# Patient Record
Sex: Female | Born: 1970 | Race: White | Hispanic: No | Marital: Married | Smoking: Former smoker
Health system: Southern US, Community
[De-identification: ages and names within clinical notes are randomized; demographics above are authoritative.]

## PROBLEM LIST (undated history)

## (undated) DIAGNOSIS — F32A Depression, unspecified: Secondary | ICD-10-CM

## (undated) DIAGNOSIS — Z5189 Encounter for other specified aftercare: Secondary | ICD-10-CM

## (undated) DIAGNOSIS — F329 Major depressive disorder, single episode, unspecified: Secondary | ICD-10-CM

## (undated) DIAGNOSIS — F419 Anxiety disorder, unspecified: Secondary | ICD-10-CM

## (undated) HISTORY — PX: ABDOMINAL SURGERY: SHX537

## (undated) HISTORY — PX: ABDOMINAL HYSTERECTOMY: SHX81

## (undated) HISTORY — PX: TUBAL LIGATION: SHX77

---

## 2005-06-06 ENCOUNTER — Emergency Department (HOSPITAL_COMMUNITY): Admission: EM | Admit: 2005-06-06 | Discharge: 2005-06-06 | Payer: Self-pay | Admitting: Emergency Medicine

## 2005-06-07 ENCOUNTER — Encounter: Admission: RE | Admit: 2005-06-07 | Discharge: 2005-06-07 | Payer: Self-pay | Admitting: Family Medicine

## 2009-05-03 ENCOUNTER — Emergency Department (HOSPITAL_COMMUNITY): Admission: EM | Admit: 2009-05-03 | Discharge: 2009-05-03 | Payer: Self-pay | Admitting: Emergency Medicine

## 2010-03-09 ENCOUNTER — Emergency Department (HOSPITAL_COMMUNITY): Admission: EM | Admit: 2010-03-09 | Discharge: 2010-03-09 | Payer: Self-pay | Admitting: Emergency Medicine

## 2010-10-08 LAB — DIFFERENTIAL
Basophils Absolute: 0.1 10*3/uL (ref 0.0–0.1)
Basophils Relative: 1 % (ref 0–1)
Eosinophils Absolute: 0.2 10*3/uL (ref 0.0–0.7)
Eosinophils Relative: 2 % (ref 0–5)
Lymphocytes Relative: 20 % (ref 12–46)
Lymphs Abs: 2.3 10*3/uL (ref 0.7–4.0)
Monocytes Absolute: 0.7 10*3/uL (ref 0.1–1.0)
Monocytes Relative: 6 % (ref 3–12)
Neutro Abs: 8.3 10*3/uL — ABNORMAL HIGH (ref 1.7–7.7)
Neutrophils Relative %: 72 % (ref 43–77)

## 2010-10-08 LAB — RAPID URINE DRUG SCREEN, HOSP PERFORMED
Amphetamines: NOT DETECTED
Barbiturates: NOT DETECTED
Benzodiazepines: NOT DETECTED
Cocaine: NOT DETECTED
Opiates: NOT DETECTED
Tetrahydrocannabinol: NOT DETECTED

## 2010-10-08 LAB — PREGNANCY, URINE: Preg Test, Ur: NEGATIVE

## 2010-10-08 LAB — URINALYSIS, ROUTINE W REFLEX MICROSCOPIC
Bilirubin Urine: NEGATIVE
Glucose, UA: NEGATIVE mg/dL
Hgb urine dipstick: NEGATIVE
Ketones, ur: NEGATIVE mg/dL
Nitrite: NEGATIVE
Protein, ur: NEGATIVE mg/dL
Specific Gravity, Urine: 1.011 (ref 1.005–1.030)
Urobilinogen, UA: 0.2 mg/dL (ref 0.0–1.0)
pH: 7.5 (ref 5.0–8.0)

## 2010-10-08 LAB — CBC
HCT: 40.5 % (ref 36.0–46.0)
Hemoglobin: 13.7 g/dL (ref 12.0–15.0)
MCH: 33 pg (ref 26.0–34.0)
MCHC: 33.8 g/dL (ref 30.0–36.0)
MCV: 97.6 fL (ref 78.0–100.0)
Platelets: 293 10*3/uL (ref 150–400)
RBC: 4.15 MIL/uL (ref 3.87–5.11)
RDW: 13.3 % (ref 11.5–15.5)
WBC: 11.5 10*3/uL — ABNORMAL HIGH (ref 4.0–10.5)

## 2010-10-08 LAB — BASIC METABOLIC PANEL
BUN: 12 mg/dL (ref 6–23)
CO2: 25 mEq/L (ref 19–32)
Calcium: 9.4 mg/dL (ref 8.4–10.5)
Chloride: 107 mEq/L (ref 96–112)
Creatinine, Ser: 0.59 mg/dL (ref 0.4–1.2)
GFR calc Af Amer: >60 mL/min (ref >60)
GFR calc non Af Amer: >60 mL/min (ref >60)
Glucose, Bld: 104 mg/dL — ABNORMAL HIGH (ref 70–99)
Potassium: 4.5 mEq/L (ref 3.5–5.1)
Sodium: 139 mEq/L (ref 135–145)

## 2010-10-08 LAB — ETHANOL: Alcohol, Ethyl (B): 5 mg/dL (ref 0–10)

## 2010-11-22 ENCOUNTER — Other Ambulatory Visit: Payer: Self-pay | Admitting: Obstetrics and Gynecology

## 2010-11-22 ENCOUNTER — Encounter (HOSPITAL_COMMUNITY): Payer: Managed Care, Other (non HMO)

## 2010-11-22 LAB — COMPREHENSIVE METABOLIC PANEL
ALT: 17 U/L (ref 0–35)
AST: 16 U/L (ref 0–37)
Albumin: 3.9 g/dL (ref 3.5–5.2)
Alkaline Phosphatase: 58 U/L (ref 39–117)
BUN: 9 mg/dL (ref 6–23)
Calcium: 9.1 mg/dL (ref 8.4–10.5)
Creatinine, Ser: 0.51 mg/dL (ref 0.4–1.2)
GFR calc Af Amer: >60 mL/min (ref >60)
GFR calc non Af Amer: >60 mL/min (ref >60)
Potassium: 3.8 mEq/L (ref 3.5–5.1)
Sodium: 137 mEq/L (ref 135–145)
Total Bilirubin: 0.5 mg/dL (ref 0.3–1.2)
Total Protein: 7.3 g/dL (ref 6.0–8.3)

## 2010-11-22 LAB — DIFFERENTIAL
Basophils Absolute: 0.1 10*3/uL (ref 0.0–0.1)
Basophils Relative: 1 % (ref 0–1)
Basophils Relative: 1 % (ref 0–1)
Eosinophils Absolute: 0.3 10*3/uL (ref 0.0–0.7)
Eosinophils Absolute: 0.3 10*3/uL (ref 0.0–0.7)
Eosinophils Relative: 2 % (ref 0–5)
Eosinophils Relative: 2 % (ref 0–5)
Lymphs Abs: 2.7 10*3/uL (ref 0.7–4.0)
Lymphs Abs: 2.7 10*3/uL (ref 0.7–4.0)
Monocytes Absolute: 1.1 10*3/uL — ABNORMAL HIGH (ref 0.1–1.0)
Monocytes Absolute: 1.1 10*3/uL — ABNORMAL HIGH (ref 0.1–1.0)
Monocytes Relative: 8 % (ref 3–12)
Monocytes Relative: 8 % (ref 3–12)
Neutro Abs: 9.2 10*3/uL — ABNORMAL HIGH (ref 1.7–7.7)
Neutro Abs: 9.2 10*3/uL — ABNORMAL HIGH (ref 1.7–7.7)
Neutrophils Relative %: 69 % (ref 43–77)
Neutrophils Relative %: 69 % (ref 43–77)

## 2010-11-22 LAB — CBC
HCT: 41.5 % (ref 36.0–46.0)
Hemoglobin: 13.8 g/dL (ref 12.0–15.0)
MCH: 32 pg (ref 26.0–34.0)
MCHC: 33.3 g/dL (ref 30.0–36.0)
MCV: 96.3 fL (ref 78.0–100.0)
Platelets: 326 10*3/uL (ref 150–400)
RBC: 4.31 MIL/uL (ref 3.87–5.11)
WBC: 13.3 10*3/uL — ABNORMAL HIGH (ref 4.0–10.5)

## 2010-11-22 LAB — SURGICAL PCR SCREEN
MRSA, PCR: NEGATIVE
Staphylococcus aureus: NEGATIVE

## 2010-11-29 ENCOUNTER — Other Ambulatory Visit: Payer: Self-pay | Admitting: Obstetrics and Gynecology

## 2010-11-29 ENCOUNTER — Ambulatory Visit (HOSPITAL_COMMUNITY)
Admission: RE | Admit: 2010-11-29 | Discharge: 2010-11-30 | Disposition: A | Discharge status: Home / Self Care | Payer: Managed Care, Other (non HMO) | Source: Ambulatory Visit | Attending: Obstetrics and Gynecology | Admitting: Obstetrics and Gynecology

## 2010-11-29 DIAGNOSIS — N946 Dysmenorrhea, unspecified: Secondary | ICD-10-CM | POA: Insufficient documentation

## 2010-11-29 DIAGNOSIS — Z01818 Encounter for other preprocedural examination: Secondary | ICD-10-CM | POA: Insufficient documentation

## 2010-11-29 DIAGNOSIS — N920 Excessive and frequent menstruation with regular cycle: Secondary | ICD-10-CM | POA: Insufficient documentation

## 2010-11-29 DIAGNOSIS — Z01812 Encounter for preprocedural laboratory examination: Secondary | ICD-10-CM | POA: Insufficient documentation

## 2010-11-30 LAB — CBC
HCT: 34 % — ABNORMAL LOW (ref 36.0–46.0)
Hemoglobin: 11.1 g/dL — ABNORMAL LOW (ref 12.0–15.0)
MCH: 31.7 pg (ref 26.0–34.0)
MCHC: 32.6 g/dL (ref 30.0–36.0)
MCV: 97.1 fL (ref 78.0–100.0)
RBC: 3.5 MIL/uL — ABNORMAL LOW (ref 3.87–5.11)
RDW: 13 % (ref 11.5–15.5)

## 2010-12-09 NOTE — H&P (Addendum)
Kim [name], [name]               ACCOUNT NO.:  [id number]  MEDICAL RECORD NO.:  [id number]           PATIENT TYPE:  LOCATION:                                 FACILITY:  PHYSICIAN:  Malachi Pro. Ambrose Mantle, M.D. DATE OF BIRTH:  June 24, 1971  DATE OF ADMISSION:  11/29/2010 DATE OF DISCHARGE:                             HISTORY & PHYSICAL   PRESENT ILLNESS:  This is a 40 year old female para 2-0-0-3 who is admitted to the hospital for persistent abnormal uterine bleeding and dysmenorrhea for a vaginal hysterectomy.  The patient's last menstrual period was October 15, 2010 that lasted 20 days prior.  It was October 02, 2010, lasted 7 days.  Before that September 17, 2010, lasted 7 days.  I saw her initially in 2008 and at that time she was having painful periods.  I treated her with nonsteroidal anti-inflammatory drugs and in 2009 she returned with complaints of abnormal uterine bleeding.  She was treated with progesterone on a cyclic basis.  This was not effective so she was treated with 28 days of progesterone and again that was ineffective.  An ultrasound done in July 2011 after being gone for 2-1/2 years, the patient returned in July 2011 with continued complaints of abnormal bleeding and underwent an ultrasound which showed a thin endometrium measuring a little over 4 mm.  No fibroids or any other pelvic abnormalities to explain the abnormal bleeding.  In December 2011 because of continued complaints of abnormal bleeding, she underwent an endometrial biopsy that showed proliferative endometrium with breakdown and degenerative changes.  No hyperplasia or carcinoma.  She has continued to take progesterone, Provera 10 mg days 16 through 25 of her cycle and in spite of that she continues to have irregular bleeding starting bleeding while she is taking the progesterone and bleeding at other times that are abnormal. At this point she is ready for definitive therapy.  She continues to rate her  dysmenorrhea as 8/10.  Her past medical history reveals no major medical illnesses.  OPERATIONS:  The patient's first C-section was done at age 65 as stated. The repeat C-section was done at age 40 and then the tubal ligation at age 50.  Other operations include tooth extraction.  She had a tummy tuck in September 2007 and she has had her wisdom teeth extracted.  MEDICATIONS:  Oral magnesium, omega-3, fish oil, Provera 10 mg day 16 through 25 of each cycle, and oral zinc.  ALLERGIES:  She has no known allergies.  REPRODUCTIVE HISTORY:  In August 1990 she delivered a 6 pounds 10 ounce female by C-section and in December 1993 delivered twins by C-section, a 7 pound 7 ounces female and a 5 pound 15 ounce female.  SOCIAL HISTORY:  She drinks socially.  She does not use drugs.  She does smoke a half-a-pack of cigarettes a day.  FAMILY HISTORY:  There is a family history of diabetes, heart disease, heart attacks, and increased blood pressure.  PHYSICAL EXAMINATION:  GENERAL:  A well-developed, well-nourished white female in no distress. VITAL SIGNS:  Blood pressure is 134/72, pulse is 80, weight is 161 pounds. HEAD,  EYES, EARS, NOSE AND THROAT:  Normal.  Pharynx is clear. NECK:  Supple without thyromegaly. HEART:  Normal size and sounds.  No murmurs. LUNGS:  Clear to auscultation. GENITOURINARY:  Vulva and vagina are clean.  The cervix is clean.  There is moderate relaxation of the uterus.  The uterus is anterior normal size.  The adnexa are free of masses. RECTAL:  No thickening of the cul-de-sac and no nodularity.  ADMITTING IMPRESSION:  Persistent abnormal uterine bleeding, menorrhagia, using up to 15 pads a day, and persistent dysmenorrhea, 8/10 on a scale of 10.  The patient has been treated adequately with medical therapy by ultrasound.  There is no indication that a D&C would be helpful and the patient is prepared for vaginal hysterectomy.  She has been informed that doing a  vaginal hysterectomy after 2 prior C- sections gives me some concern because of the likelihood of scarring in the area of the bladder and she has been informed that it is an increased likelihood that we will suffer bladder injury, but because of the mobility of the uterus, I feel that it can be done.  She has also been informed that there could be bowel injury, hemorrhage with need for reoperation and/or transfusion, fistula formation, nerve injury, intestinal obstruction, and infection.  She has also been counseled that no guarantee can be given as to the impact of the surgery on her sex drive or sexual function.  She understands and agrees to proceed.  She has also been told that if the vaginal hysterectomy is unsuccessful, I will complete the procedure abdominally.     Malachi Pro. Ambrose Mantle, M.D.     TFH/MEDQ  D:  11/28/2010  T:  11/28/2010  Job:  [id number]  Electronically Signed by Tracey Harries M.D. on 12/09/2010 11:31:23 AM

## 2010-12-09 NOTE — Discharge Summary (Signed)
  Kim [name], Kim [name]               ACCOUNT NO.:  [id number]  MEDICAL RECORD NO.:  [id number]           PATIENT TYPE:  O  LOCATION:  9310                          FACILITY:  [facility]  PHYSICIAN:  Malachi Pro. Ambrose Mantle, M.D. DATE OF BIRTH:  1971-04-16  DATE OF ADMISSION:  11/29/2010 DATE OF DISCHARGE:  11/30/2010                              DISCHARGE SUMMARY   This is a 40 year old white female who is admitted for persistent abnormal uterine bleeding, menorrhagia, and dysmenorrhea, for vaginal hysterectomy.  The patient had 2 prior C-sections, so scarring of the lower uterine segment was anticipated.  The lower uterine segment was found to be extremely scarred, but we were able to accomplish the surgery.  There were also omental adhesions of the fundus of the uterus that were divided.  Postoperatively, the patient did quite well and on the first postop day was felt to be ready for discharge.  She had ambulated well without difficulty, voided well.  Her abdomen was soft and nontender.  She was tolerating a regular diet.  Her initial hemoglobin was 13.8, hematocrit 41.5, white count 13,300, platelet count 326,000, 69 segs, 20 lymphs, 8 monos, 2 eosinophils, and 1 basophil. Followup hemoglobin was 11.1, hematocrit 34.0, and platelet count 270,000.  Comprehensive metabolic profile was completely normal. Estimated glomerular filtration rate was greater than 60.  MRSA and Staph aureus checks were negative.  FINAL DIAGNOSES:  Menorrhagia, abnormal uterine bleeding unresponsive to medical therapy, dysmenorrhea.  OPERATION:  Vaginal hysterectomy, division of omental to uterine adhesions.  FINAL CONDITION:  Improved.  INSTRUCTIONS:  Our regular discharge instructions.  No vaginal entrance. No heavy lifting or strenuous activity.  Call with any fever above 100.4 degrees.  Call with any unusual problems.  MEDICATIONS: 1. Motrin 600 mg, 30 tablets, 1 every 6 hours as needed for pain. 2. Percocet  5/325, 30 tablets, 1 every 4-6 hours as needed for pain.  The patient is advised to return in 1-2 weeks for followup examination.     Malachi Pro. Ambrose Mantle, M.D.     TFH/MEDQ  D:  11/30/2010  T:  11/30/2010  Job:  [id number]  Electronically Signed by Tracey Harries M.D. on 12/09/2010 11:30:30 AM

## 2010-12-09 NOTE — Op Note (Signed)
Kim [name], Kim [name]               ACCOUNT NO.:  [id number]  MEDICAL RECORD NO.:  [id number]           PATIENT TYPE:  O  LOCATION:  9310                          [facility]ACILITY:  [facility]  PHYSICIAN:  Malachi Pro. Ambrose Mantle, M.D. DATE OF BIRTH:  1970-11-22  DATE OF PROCEDURE:  11/29/2010 DATE OF DISCHARGE:                              OPERATIVE REPORT   PREOPERATIVE DIAGNOSES: 1. Menorrhagia. 2. Abnormal uterine bleeding. 3. Dysmenorrhea. 4. Unresponsive to medical therapy.  POSTOPERATIVE DIAGNOSES: 1. Menorrhagia. 2. Abnormal uterine bleeding. 3. Dysmenorrhea. 4. Unresponsive to medical therapy. 5. Adhesions of the lower uterine segment secondary to her prior C-     sections and omental adhesions to the uterine fundus.  OPERATION:  Vaginal hysterectomy.  OPERATOR:  Malachi Pro. Ambrose Mantle, MD  ASSISTANT:  Sherron Monday, MD  ANESTHESIA:  General anesthesia.  DESCRIPTION OF PROCEDURE:  The patient was brought to the operating room and placed under satisfactory general anesthesia and placed in lithotomy position.  A time-out was done.  The vulva, vagina, perineum, and urethra were prepped with Betadine solution and then draped as a sterile field after the urethra was prepped and a Foley catheter was inserted to straight drain.  Exam revealed the uterus to be anterior, normal size. It was mobile.  The adnexa were free of masses.  The cul-de-sac had no nodularity.  After draping as a sterile field, a weighted speculum was placed posteriorly, a Beaver retractor was placed anteriorly, the cervix was grasped with Leahy clamps, and injection of a dilute solution of Neo- Synephrine was done at the cervicovaginal junction in a circumferential manner.  A circumferential incision was then made around the cervix at the cervicovaginal junction and the vaginal mucosa was separated from the lower uterine segment anteriorly.  I did not see the peritoneal reflection.  I then turned my attention posteriorly and  it was somewhat difficult to enter posteriorly, but I did ultimately get into the posterior cul-de-sac.  A banana retractor was placed and the uterosacral ligaments bilaterally were clamped, cut, and suture ligated and held. The cardinal ligaments were clamped, cut, and suture ligated.  I then made an attempt to identify the anterior peritoneum, but it was obvious that there was considerable scarring in the lower uterine segment.  I therefore continued to work up both sides of the broad ligament clamping, cutting, and suture ligating the pedicles as I went.  There was considerable bleeding from the uterine vessels themselves and I had to place several free ties along the uterine arteries bilaterally. Every bite or so I would attempt to find the anterior peritoneum, but continued to find only scar tissue.  Ultimately, I did enter the anterior peritoneum, then was able to invert the uterus through the incision in the cul-de-sac, and clamped across the upper pedicles bilaterally.  At the time I was almost ready to remove the uterus, it was apparent that we had omental scarring to the uterine fundus that I divided partially with the Bovie and partially by clamping, cutting, and suture ligating the omental adhesion.  There was no evidence of any bowel in the omental adhesion.  The  uterus was removed.  The upper pedicles were suture ligated and then doubly ligated.  Inspection of the ovaries revealed them to be completely normal.  Both tubes had evidence of previous tubal ligation.  There was a little bit of bleeding that seemed to be coming from both the right vaginal cuff and the right broad ligament close to the uterosacral ligament.  I ran the posterior vaginal cuff with a lock suture of 0-Vicryl.  Then had to do a couple of figure- of-eight sutures on the right side to control some bleeding.  At this point, there did not seem to be any bleeding of significance.  I sutured the uterosacral  ligaments together in the midline including some peritoneum from the posterior cul-de-sac.  I then closed the vaginal cuff with interrupted figure-of-eight sutures of 0-Vicryl and tied the uterosacral ligaments together with the sutures that I had held when I initially sutured them.  At this point, there was no bleeding.  The vaginal cuff was closed, the urine was clear, and the patient was awakened and returned to recovery in satisfactory condition.  Blood loss about 250 mL.  Sponge and needle counts correct.     Malachi Pro. Ambrose Mantle, M.D.     TFH/MEDQ  D:  11/29/2010  T:  11/30/2010  Job:  [id number]  Electronically Signed by Tracey Harries M.D. on 12/09/2010 11:31:29 AM

## 2011-05-16 ENCOUNTER — Emergency Department (HOSPITAL_COMMUNITY)
Admission: EM | Admit: 2011-05-16 | Discharge: 2011-05-16 | Disposition: A | Discharge status: Home / Self Care | Payer: Self-pay | Attending: Emergency Medicine | Admitting: Emergency Medicine

## 2011-05-16 DIAGNOSIS — H6590 Unspecified nonsuppurative otitis media, unspecified ear: Secondary | ICD-10-CM | POA: Insufficient documentation

## 2011-05-16 DIAGNOSIS — N390 Urinary tract infection, site not specified: Secondary | ICD-10-CM | POA: Insufficient documentation

## 2011-05-16 DIAGNOSIS — J309 Allergic rhinitis, unspecified: Secondary | ICD-10-CM | POA: Insufficient documentation

## 2011-05-16 LAB — URINALYSIS, ROUTINE W REFLEX MICROSCOPIC
Bilirubin Urine: NEGATIVE
Glucose, UA: NEGATIVE mg/dL
Hgb urine dipstick: NEGATIVE
Ketones, ur: NEGATIVE mg/dL
Nitrite: NEGATIVE
Specific Gravity, Urine: 1.016 (ref 1.005–1.030)
Urobilinogen, UA: 1 mg/dL (ref 0.0–1.0)
pH: 7 (ref 5.0–8.0)

## 2013-05-22 ENCOUNTER — Ambulatory Visit (INDEPENDENT_AMBULATORY_CARE_PROVIDER_SITE_OTHER): Payer: BC Managed Care – PPO | Admitting: Radiology

## 2013-05-22 ENCOUNTER — Ambulatory Visit (INDEPENDENT_AMBULATORY_CARE_PROVIDER_SITE_OTHER): Payer: BC Managed Care – PPO | Admitting: Diagnostic Neuroimaging

## 2013-05-22 DIAGNOSIS — R200 Anesthesia of skin: Secondary | ICD-10-CM

## 2013-05-22 DIAGNOSIS — R209 Unspecified disturbances of skin sensation: Secondary | ICD-10-CM

## 2013-05-22 DIAGNOSIS — Z0289 Encounter for other administrative examinations: Secondary | ICD-10-CM

## 2013-05-23 NOTE — Procedures (Signed)
   [facility]  NCS (NERVE CONDUCTION STUDY) WITH EMG (ELECTROMYOGRAPHY) REPORT   STUDY DATE: 05/22/13 PATIENT NAME: Kim [name] DOB: 10-Aug-1970 MRN: [id number]  ORDERING CLINICIAN: Rankins  TECHNOLOGIST: Kaylyn Lim ELECTROMYOGRAPHER: Glenford Bayley. Jabin Tapp, MD  CLINICAL INFORMATION: 42 year old female with left arm pain/numbness and neck pain.  FINDINGS: NERVE CONDUCTION STUDY: Left median and left ulnar motor responses have normal distal latencies, amplitudes, conduction velocities and F-wave latencies. Left median and left ulnar sensory responses are normal. Left median and left ulnar transcarpal mixed nerve responses are normal.  NEEDLE ELECTROMYOGRAPHY: Needle examination of selected muscles of left upper extremity (deltoid, biceps, triceps, flexor carpi radialis, first dorsal interosseous) and left C5-6 and C7-T1 paraspinal muscles are normal. No abnormal spontaneous activity at rest and normal motor unit recruitment on exertion.  IMPRESSION:  This is a normal study. No electrodiagnostic evidence of large fiber neuropathy, myopathy or left cervical radiculopathy at this time.   INTERPRETING PHYSICIAN:  Suanne Marker, MD Certified in Neurology, Neurophysiology and Neuroimaging  [facility] [address], [address] [place], [place] [address] [phone]

## 2013-06-25 ENCOUNTER — Other Ambulatory Visit: Payer: Self-pay | Admitting: Family Medicine

## 2013-06-25 DIAGNOSIS — R209 Unspecified disturbances of skin sensation: Secondary | ICD-10-CM

## 2013-07-02 ENCOUNTER — Emergency Department (HOSPITAL_COMMUNITY)
Admission: EM | Admit: 2013-07-02 | Discharge: 2013-07-02 | Disposition: A | Discharge status: Home / Self Care | Payer: BC Managed Care – PPO | Attending: Emergency Medicine | Admitting: Emergency Medicine

## 2013-07-02 ENCOUNTER — Encounter (HOSPITAL_COMMUNITY): Payer: Self-pay | Admitting: Emergency Medicine

## 2013-07-02 ENCOUNTER — Emergency Department (HOSPITAL_COMMUNITY): Payer: BC Managed Care – PPO

## 2013-07-02 DIAGNOSIS — H9209 Otalgia, unspecified ear: Secondary | ICD-10-CM | POA: Insufficient documentation

## 2013-07-02 DIAGNOSIS — W19XXXA Unspecified fall, initial encounter: Secondary | ICD-10-CM

## 2013-07-02 DIAGNOSIS — Y92009 Unspecified place in unspecified non-institutional (private) residence as the place of occurrence of the external cause: Secondary | ICD-10-CM | POA: Insufficient documentation

## 2013-07-02 DIAGNOSIS — W010XXA Fall on same level from slipping, tripping and stumbling without subsequent striking against object, initial encounter: Secondary | ICD-10-CM | POA: Insufficient documentation

## 2013-07-02 DIAGNOSIS — W108XXA Fall (on) (from) other stairs and steps, initial encounter: Secondary | ICD-10-CM | POA: Insufficient documentation

## 2013-07-02 DIAGNOSIS — IMO0002 Reserved for concepts with insufficient information to code with codable children: Secondary | ICD-10-CM | POA: Insufficient documentation

## 2013-07-02 DIAGNOSIS — R0602 Shortness of breath: Secondary | ICD-10-CM | POA: Insufficient documentation

## 2013-07-02 DIAGNOSIS — S2239XA Fracture of one rib, unspecified side, initial encounter for closed fracture: Secondary | ICD-10-CM | POA: Insufficient documentation

## 2013-07-02 DIAGNOSIS — Z87891 Personal history of nicotine dependence: Secondary | ICD-10-CM | POA: Insufficient documentation

## 2013-07-02 DIAGNOSIS — R110 Nausea: Secondary | ICD-10-CM | POA: Insufficient documentation

## 2013-07-02 DIAGNOSIS — S2232XA Fracture of one rib, left side, initial encounter for closed fracture: Secondary | ICD-10-CM

## 2013-07-02 DIAGNOSIS — Y9389 Activity, other specified: Secondary | ICD-10-CM | POA: Insufficient documentation

## 2013-07-02 HISTORY — DX: Encounter for other specified aftercare: Z51.89

## 2013-07-02 MED ORDER — HYDROCODONE-ACETAMINOPHEN 5-325 MG PO TABS
2.0000 | ORAL_TABLET | Freq: Once | ORAL | Status: AC
  Administered 2013-07-02: 2 via ORAL
  Filled 2013-07-02: qty 2

## 2013-07-02 MED ORDER — OXYCODONE-ACETAMINOPHEN 5-325 MG PO TABS: 1.0000 | ORAL_TABLET | Freq: Four times a day (QID) | ORAL | Status: AC | PRN

## 2013-07-02 MED ORDER — DIAZEPAM 5 MG PO TABS
5.0000 mg | ORAL_TABLET | Freq: Once | ORAL | Status: AC
  Administered 2013-07-02: 5 mg via ORAL
  Filled 2013-07-02: qty 1

## 2013-07-02 NOTE — ED Provider Notes (Signed)
CSN: [id number]     Arrival date & time 07/02/13  1528 History   First MD Initiated Contact with Patient 07/02/13 1549     Chief Complaint  Patient presents with  . Back Pain  . Chest Pain  . Shortness of Breath  . Otalgia   (Consider location/radiation/quality/duration/timing/severity/associated sxs/prior Treatment) HPI Comments: Pt is a 42 y/o female who presents to the ED complaining of left rib pain, sob and left sided upper back and chest pain after tripping and falling down about 7 steps at home while on her way out of her house. Unsure if she hit her head, but states she felt dazed after the fall. Left rib, upper back and chest pain described as sharp, worse when she takes a deep breath in. No alleviating factors have been tried. She is slightly nauseated. Denies dizziness, confusion, vision changes, neck pain, vomiting. Also complaining of bilateral ear pain, R worse than left which she has been on antibiotics since 12/2 for otitis media and externa, taking amoxicillin and ofloxacin ear drops. States she has pink fluid occasionally dripping from her right ear. She was on the way to the ENT office for f/u when she tripped and fell.  Patient is a 42 y.o. female presenting with back pain, chest pain, shortness of breath, and ear pain. The history is provided by the patient.  Back Pain Associated symptoms: chest pain   Chest Pain Associated symptoms: back pain, nausea and shortness of breath   Shortness of Breath Associated symptoms: chest pain and ear pain   Otalgia   Past Medical History  Diagnosis Date  . Blood transfusion without reported diagnosis    Past Surgical History  Procedure Laterality Date  . Cesarean section    . Abdominal surgery      tummy tuck  . Tubal ligation    . Abdominal hysterectomy     No family history on file. History  Substance Use Topics  . Smoking status: Former Smoker    Types: Cigarettes    Quit date: 07/02/2012  . Smokeless tobacco: Not on  file  . Alcohol Use: Yes     Comment: sociaklly   OB History   Grav Para Term Preterm Abortions TAB SAB Ect Mult Living                 Review of Systems  HENT: Positive for ear pain.   Respiratory: Positive for shortness of breath.   Cardiovascular: Positive for chest pain.  Gastrointestinal: Positive for nausea.  Musculoskeletal: Positive for back pain.  All other systems reviewed and are negative.    Allergies  Review of patient's allergies indicates no known allergies.  Home Medications  No current outpatient prescriptions on file. BP 133/72  Pulse 90  Temp(Src) 97.9 F (36.6 C) (Oral)  Resp 12  SpO2 96% Physical Exam  Nursing note and vitals reviewed. Constitutional: She is oriented to person, place, and time. She appears well-developed and well-nourished. No distress.  HENT:  Head: Normocephalic and atraumatic.  Right Ear: No hemotympanum.  Left Ear: Tympanic membrane and ear canal normal. No hemotympanum.  Mouth/Throat: Oropharynx is clear and moist.  Right TM mild injection. Right ear canal inflamed, moist. No drainage.  Eyes: Conjunctivae and EOM are normal. Pupils are equal, round, and reactive to light.  Neck: Normal range of motion. Neck supple. No spinous process tenderness and no muscular tenderness present.  Cardiovascular: Normal rate, regular rhythm, normal heart sounds and intact distal pulses.   Pulmonary/Chest:  Effort normal and breath sounds normal. No respiratory distress. She has no decreased breath sounds. She exhibits tenderness.    No bruising, deformity, step off, signs of trauma.  Abdominal: Soft. Bowel sounds are normal. There is no tenderness.  Musculoskeletal: Normal range of motion. She exhibits no edema.       Thoracic back: She exhibits tenderness. She exhibits no bony tenderness.       Back:  Neurological: She is alert and oriented to person, place, and time. She has normal strength. No cranial nerve deficit or sensory deficit. She  displays a negative Romberg sign. Coordination normal. GCS eye subscore is 4. GCS verbal subscore is 5. GCS motor subscore is 6.  Skin: Skin is warm and dry. She is not diaphoretic.  Psychiatric: She has a normal mood and affect. Her behavior is normal.    ED Course  Procedures (including critical care time) Labs Review Labs Reviewed - No data to display Imaging Review Dg Ribs Unilateral W/chest Left  07/02/2013   CLINICAL DATA:  Larey Seat down stairs, left chest pain, shortness of breath  EXAM: LEFT RIBS AND CHEST - 3+ VIEW  COMPARISON:  None  FINDINGS: Normal heart size, mediastinal contours, and pulmonary vascularity.  Lungs clear.  No pleural effusion or pneumothorax.  Osseous mineralization grossly normal.  Questionable contour abnormality at the lateral left 5th rib suspicious for a nondisplaced fracture. No additional focal bony abnormalities identified.  IMPRESSION: Question nondisplaced fracture of the lateral left 5th rib.   Electronically Signed   By: Ulyses Southward M.D.   On: 07/02/2013 16:30    EKG Interpretation   None       MDM   1. Rib fracture, left, closed, initial encounter   2. Fall, initial encounter     Pt presenting with rib, chest and back pain after fall. She is well appearing and in NAD. No respiratory distress. No signs of trauma. She felt dazed after fall, unsure of head trauma- no focal neuro deficits. No need for CT head at this time. Xray ribs w chest pending. Will treat pain. Regarding ear complaints, no drainage noted, ears do not appear to be acutely inflamed, she is on antibiotics and has f/u with ENT. 4:54 PM Xray showing questionable nondisplaced fracture of lateral left 5th rib. Stable for discharge home with pain control, incentive spirometry. Return precautions given. Patient states understanding of treatment care plan and is agreeable.   Trevor Mace, PA-C 07/02/13 1655

## 2013-07-02 NOTE — ED Notes (Signed)
Pt from home c/o slipping,  falling down 7 steps today. Pt reports that she doesn't know if she hit her head, but states that she was dazed. Pt c/o L sided rib pain, SOB, back pain. Pt has multiple complaints, bilateral ear pain with "blood coming out." Pt is A&O and in NAD. Pt husband at bedside

## 2013-07-03 ENCOUNTER — Other Ambulatory Visit: Payer: Managed Care, Other (non HMO)

## 2013-07-03 NOTE — ED Provider Notes (Signed)
Medical screening examination/treatment/procedure(s) were performed by non-physician practitioner and as supervising physician I was immediately available for consultation/collaboration.  EKG Interpretation   None        Raeford Razor, MD 07/03/13 1328

## 2013-09-10 ENCOUNTER — Other Ambulatory Visit: Payer: Managed Care, Other (non HMO)

## 2013-09-14 ENCOUNTER — Other Ambulatory Visit: Payer: Managed Care, Other (non HMO)

## 2014-01-01 ENCOUNTER — Other Ambulatory Visit: Payer: Self-pay | Admitting: Family Medicine

## 2014-01-01 DIAGNOSIS — R200 Anesthesia of skin: Secondary | ICD-10-CM

## 2014-01-07 ENCOUNTER — Encounter (INDEPENDENT_AMBULATORY_CARE_PROVIDER_SITE_OTHER): Payer: Self-pay

## 2014-01-07 ENCOUNTER — Other Ambulatory Visit: Payer: Self-pay | Admitting: Cardiology

## 2014-01-07 ENCOUNTER — Ambulatory Visit
Admission: RE | Admit: 2014-01-07 | Discharge: 2014-01-07 | Disposition: A | Discharge status: Home / Self Care | Payer: 59 | Source: Ambulatory Visit | Attending: Cardiology | Admitting: Cardiology

## 2014-01-07 DIAGNOSIS — R0602 Shortness of breath: Secondary | ICD-10-CM

## 2014-01-10 ENCOUNTER — Ambulatory Visit
Admission: RE | Admit: 2014-01-10 | Discharge: 2014-01-10 | Disposition: A | Discharge status: Home / Self Care | Payer: 59 | Source: Ambulatory Visit | Attending: Family Medicine | Admitting: Family Medicine

## 2014-01-10 DIAGNOSIS — R200 Anesthesia of skin: Secondary | ICD-10-CM

## 2014-03-17 ENCOUNTER — Other Ambulatory Visit: Payer: Self-pay | Admitting: Otolaryngology

## 2014-03-17 DIAGNOSIS — R0981 Nasal congestion: Secondary | ICD-10-CM

## 2014-03-19 ENCOUNTER — Ambulatory Visit
Admission: RE | Admit: 2014-03-19 | Discharge: 2014-03-19 | Disposition: A | Discharge status: Home / Self Care | Payer: 59 | Source: Ambulatory Visit | Attending: Otolaryngology | Admitting: Otolaryngology

## 2014-03-19 DIAGNOSIS — R0981 Nasal congestion: Secondary | ICD-10-CM

## 2014-06-27 ENCOUNTER — Emergency Department (HOSPITAL_COMMUNITY): Payer: 59

## 2014-06-27 ENCOUNTER — Other Ambulatory Visit: Payer: Self-pay

## 2014-06-27 ENCOUNTER — Encounter (HOSPITAL_COMMUNITY): Payer: Self-pay | Admitting: *Deleted

## 2014-06-27 ENCOUNTER — Emergency Department (HOSPITAL_COMMUNITY)
Admission: EM | Admit: 2014-06-27 | Discharge: 2014-06-27 | Disposition: A | Discharge status: Home / Self Care | Payer: 59 | Attending: Emergency Medicine | Admitting: Emergency Medicine

## 2014-06-27 DIAGNOSIS — Z87891 Personal history of nicotine dependence: Secondary | ICD-10-CM | POA: Diagnosis not present

## 2014-06-27 DIAGNOSIS — R0602 Shortness of breath: Secondary | ICD-10-CM | POA: Diagnosis not present

## 2014-06-27 DIAGNOSIS — Z79899 Other long term (current) drug therapy: Secondary | ICD-10-CM | POA: Insufficient documentation

## 2014-06-27 DIAGNOSIS — F329 Major depressive disorder, single episode, unspecified: Secondary | ICD-10-CM | POA: Diagnosis not present

## 2014-06-27 DIAGNOSIS — R05 Cough: Secondary | ICD-10-CM | POA: Diagnosis not present

## 2014-06-27 DIAGNOSIS — R112 Nausea with vomiting, unspecified: Secondary | ICD-10-CM | POA: Insufficient documentation

## 2014-06-27 DIAGNOSIS — Z720 Tobacco use: Secondary | ICD-10-CM

## 2014-06-27 DIAGNOSIS — F419 Anxiety disorder, unspecified: Secondary | ICD-10-CM | POA: Diagnosis not present

## 2014-06-27 DIAGNOSIS — R079 Chest pain, unspecified: Secondary | ICD-10-CM

## 2014-06-27 DIAGNOSIS — R0789 Other chest pain: Secondary | ICD-10-CM | POA: Diagnosis present

## 2014-06-27 DIAGNOSIS — Z791 Long term (current) use of non-steroidal anti-inflammatories (NSAID): Secondary | ICD-10-CM | POA: Insufficient documentation

## 2014-06-27 DIAGNOSIS — Z86711 Personal history of pulmonary embolism: Secondary | ICD-10-CM | POA: Insufficient documentation

## 2014-06-27 DIAGNOSIS — R6883 Chills (without fever): Secondary | ICD-10-CM | POA: Diagnosis not present

## 2014-06-27 DIAGNOSIS — Z79818 Long term (current) use of other agents affecting estrogen receptors and estrogen levels: Secondary | ICD-10-CM | POA: Insufficient documentation

## 2014-06-27 HISTORY — DX: Anxiety disorder, unspecified: F41.9

## 2014-06-27 HISTORY — DX: Major depressive disorder, single episode, unspecified: F32.9

## 2014-06-27 HISTORY — DX: Depression, unspecified: F32.A

## 2014-06-27 LAB — BASIC METABOLIC PANEL
Anion gap: 19 — ABNORMAL HIGH (ref 5–15)
BUN: 6 mg/dL (ref 6–23)
CO2: 20 mEq/L (ref 19–32)
CREATININE: 0.53 mg/dL (ref 0.50–1.10)
Calcium: 10.1 mg/dL (ref 8.4–10.5)
Chloride: 98 mEq/L (ref 96–112)
GFR calc non Af Amer: >90 mL/min (ref >90)
Glucose, Bld: 80 mg/dL (ref 70–99)
POTASSIUM: 4 meq/L (ref 3.7–5.3)
Sodium: 137 mEq/L (ref 137–147)

## 2014-06-27 LAB — CBC
HCT: 44.9 % (ref 36.0–46.0)
Hemoglobin: 15 g/dL (ref 12.0–15.0)
MCH: 31 pg (ref 26.0–34.0)
MCHC: 33.4 g/dL (ref 30.0–36.0)
MCV: 92.8 fL (ref 78.0–100.0)
PLATELETS: 254 10*3/uL (ref 150–400)
RBC: 4.84 MIL/uL (ref 3.87–5.11)
RDW: 13.9 % (ref 11.5–15.5)
WBC: 12 10*3/uL — AB (ref 4.0–10.5)

## 2014-06-27 LAB — PRO B NATRIURETIC PEPTIDE: PRO B NATRI PEPTIDE: 94.2 pg/mL (ref 0–125)

## 2014-06-27 MED ORDER — HYDROCODONE-HOMATROPINE 5-1.5 MG/5ML PO SYRP
5.0000 mL | ORAL_SOLUTION | Freq: Once | ORAL | Status: AC
  Administered 2014-06-27: 5 mL via ORAL

## 2014-06-27 MED ORDER — IOHEXOL 350 MG/ML SOLN
100.0000 mL | Freq: Once | INTRAVENOUS | Status: AC | PRN
  Administered 2014-06-27: 100 mL via INTRAVENOUS

## 2014-06-27 MED ORDER — HYDROCODONE-HOMATROPINE 5-1.5 MG/5ML PO SYRP: 5.0000 mL | ORAL_SOLUTION | Freq: Four times a day (QID) | ORAL | Status: AC | PRN

## 2014-06-27 MED ORDER — ALBUTEROL SULFATE HFA 108 (90 BASE) MCG/ACT IN AERS: 2.0000 | INHALATION_SPRAY | Freq: Four times a day (QID) | RESPIRATORY_TRACT | Status: AC | PRN

## 2014-06-27 NOTE — ED Notes (Signed)
Patient transported to CT 

## 2014-06-27 NOTE — ED Notes (Signed)
Attempted to draw pts labs was unsuccessful. Called phlebotomy and spoke with [place]. Phlebotomy to draw pts labs

## 2014-06-27 NOTE — ED Provider Notes (Signed)
The patient is a [age] year old female, she has a history of a cesarean section, hysterectomy and is recently placed on estrogen, [place] and has had approximately 3 months of a mild cough. She also has been having night sweats which is why she was placed on estrogen. She denies any weight loss, has a chest pain which is in the middle of her chest and radiates under the right breast. She does have a history of a broken rib approximately one year ago when she fell down the stairs. Her exam is unremarkable, chest pain is mildly reproducible to palpation, no difficulty breathing, normal vital signs, no peripheral edema, normal heart and lung sounds. EKG is also unremarkable, the patient appears stable, CT scan to rule out blood clot as the patient has had a prior DVT when she was pregnant in the past. The patient is in agreement with the plan.  ED ECG REPORT  I personally interpreted this EKG   Date: 06/28/2014   Rate: 76  Rhythm: normal sinus rhythm  QRS Axis: normal  Intervals: normal  ST/T Wave abnormalities: normal  Conduction Disutrbances:none  Narrative Interpretation:   Old EKG Reviewed: none available   Medical screening examination/treatment/procedure(s) were conducted as a shared visit with non-physician practitioner(s) and myself.  I personally evaluated the patient during the encounter.  Clinical Impression:   Final diagnoses:  Shortness of breath  Tobacco abuse         Vida RollerBrian D Hermann Dottavio, MD 06/28/14 0021

## 2014-06-27 NOTE — Discharge Instructions (Signed)
Please follow up with your primary care physician in 1-2 days. If you do not have one please call the [facility] number listed above. Please have a follow up CT scan in 3-6 months for re-evaluation of your pulmonary nodule. Please read all discharge instructions and return precautions.   Shortness of Breath Shortness of breath means you have trouble breathing. It could also mean that you have a medical problem. You should get immediate medical care for shortness of breath. CAUSES   Not enough oxygen in the air such as with high altitudes or a smoke-filled room.  Certain lung diseases, infections, or problems.  Heart disease or conditions, such as angina or heart failure.  Low red blood cells (anemia).  Poor physical fitness, which can cause shortness of breath when you exercise.  Chest or back injuries or stiffness.  Being overweight.  Smoking.  Anxiety, which can make you feel like you are not getting enough air. DIAGNOSIS  Serious medical problems can often be found during your physical exam. Tests may also be done to determine why you are having shortness of breath. Tests may include:  Chest X-rays.  Lung function tests.  Blood tests.  An electrocardiogram (ECG).  An ambulatory electrocardiogram. An ambulatory ECG records your heartbeat patterns over a 24-hour period.  Exercise testing.  A transthoracic echocardiogram (TTE). During echocardiography, sound waves are used to evaluate how blood flows through your heart.  A transesophageal echocardiogram (TEE).  Imaging scans. Your health care provider may not be able to find a cause for your shortness of breath after your exam. In this case, it is important to have a follow-up exam with your health care provider as directed.  TREATMENT  Treatment for shortness of breath depends on the cause of your symptoms and can vary greatly. HOME CARE INSTRUCTIONS   Do not smoke. Smoking is a common cause of  shortness of breath. If you smoke, ask for help to quit.  Avoid being around chemicals or things that may bother your breathing, such as paint fumes and dust.  Rest as needed. Slowly resume your usual activities.  If medicines were prescribed, take them as directed for the full length of time directed. This includes oxygen and any inhaled medicines.  Keep all follow-up appointments as directed by your health care provider. SEEK MEDICAL CARE IF:   Your condition does not improve in the time expected.  You have a hard time doing your normal activities even with rest.  You have any new symptoms. SEEK IMMEDIATE MEDICAL CARE IF:   Your shortness of breath gets worse.  You feel light-headed, faint, or develop a cough not controlled with medicines.  You start coughing up blood.  You have pain with breathing.  You have chest pain or pain in your arms, shoulders, or abdomen.  You have a fever.  You are unable to walk up stairs or exercise the way you normally do. MAKE SURE YOU:  Understand these instructions.  Will watch your condition.  Will get help right away if you are not doing well or get worse. Document Released: 04/05/2001 Document Revised: 07/16/2013 Document Reviewed: 09/26/2011 [facility] Patient Information 2015 [place], [place]. This information is not intended to replace advice given to you by your health care provider. Make sure you discuss any questions you have with your health care provider.  Nicotine Addiction Nicotine can act as both a stimulant (excites/activates) and a sedative (calms/quiets). Immediately after exposure to nicotine, there is a "kick"  caused in part by the drug's stimulation of the adrenal glands and resulting discharge of adrenaline (epinephrine). The rush of adrenaline stimulates the body and causes a sudden release of sugar. This means that smokers are always slightly hyperglycemic. Hyperglycemic means that the blood sugar is high, just like in  diabetics. Nicotine also decreases the amount of insulin which helps control sugar levels in the body. There is an increase in blood pressure, breathing, and the rate of heart beats.  In addition, nicotine indirectly causes a release of dopamine in the brain that controls pleasure and motivation. A similar reaction is seen with other drugs of abuse, such as cocaine and heroin. This dopamine release is thought to cause the pleasurable sensations when smoking. In some different cases, nicotine can also create a calming effect, depending on sensitivity of the smoker's nervous system and the dose of nicotine taken. WHAT HAPPENS WHEN NICOTINE IS TAKEN FOR LONG PERIODS OF TIME?  Long-term use of nicotine results in addiction. It is difficult to stop.  Repeated use of nicotine creates tolerance. Higher doses of nicotine are needed to get the "kick." When nicotine use is stopped, withdrawal may last a month or more. Withdrawal may begin within a few hours after the last cigarette. Symptoms peak within the first few days and may lessen within a few weeks. For some people, however, symptoms may last for months or longer. Withdrawal symptoms include:   Irritability.  Craving.  Learning and attention deficits.  Sleep disturbances.  Increased appetite. Craving for tobacco may last for 6 months or longer. Many behaviors done while using nicotine can also play a part in the severity of withdrawal symptoms. For some people, the feel, smell, and sight of a cigarette and the ritual of obtaining, handling, lighting, and smoking the cigarette are closely linked with the pleasure of smoking. When stopped, they also miss the related behaviors which make the withdrawal or craving worse. While nicotine gum and patches may lessen the drug aspects of withdrawal, cravings often persist. WHAT ARE THE MEDICAL CONSEQUENCES OF NICOTINE USE?  Nicotine addiction accounts for one-third of all cancers. The top cancer caused by  tobacco is lung cancer. Lung cancer is the number one cancer killer of both men and women.  Smoking is also associated with cancers of the:  Mouth.  Pharynx.  Larynx.  Esophagus.  Stomach.  Pancreas.  Cervix.  Kidney.  Ureter.  Bladder.  Smoking also causes lung diseases such as lasting (chronic) bronchitis and emphysema.  It worsens asthma in adults and children.  Smoking increases the risk of heart disease, including:  Stroke.  Heart attack.  Vascular disease.  Aneurysm.  Passive or secondary smoke can also increase medical risks including:  Asthma in children.  Sudden Infant Death Syndrome (SIDS).  Additionally, dropped cigarettes are the leading cause of residential fire fatalities.  Nicotine poisoning has been reported from accidental ingestion of tobacco products by children and pets. Death usually results in a few minutes from respiratory failure (when a person stops breathing) caused by paralysis. TREATMENT   Medication. Nicotine replacement medicines such as nicotine gum and the patch are used to stop smoking. These medicines gradually lower the dosage of nicotine in the body. These medicines do not contain the carbon monoxide and other toxins found in tobacco smoke.  Hypnotherapy.  Relaxation therapy.  Nicotine Anonymous (a 12-step support program). Find times and locations in your local yellow pages. Document Released: 03/16/2004 Document Revised: 10/03/2011 Document Reviewed: 09/06/2013 [employer] Patient Information  2015 [employer]. This information is not intended to replace advice given to you by your health care provider. Make sure you discuss any questions you have with your health care provider. ° °

## 2014-06-27 NOTE — ED Provider Notes (Signed)
CSN: [id number]     Arrival date & time 06/27/14  1325 History   First MD Initiated Contact with Patient 06/27/14 1752     Chief Complaint  Patient presents with  . Chest Pain     (Consider location/radiation/quality/duration/timing/severity/associated sxs/prior Treatment) HPI Comments: Patient is a 43 year old female PMHx significant for PE presenting to the emergency department for 3 months of pleuritic chest pain, shortness of breath, productive cough presenting to the ED for evaluation of her symptoms. She states over the last 2-3 weeks her symptoms have worsened. Alleviating factors: none. Aggravating factors: none. Patient states she was started on Estradiol for possible early onset menopause approximately 2-3 months ago. Patient also states she to and from [place] approximately 3 weeks ago.     Past Medical History  Diagnosis Date  . Blood transfusion without reported diagnosis   . Anxiety   . Depression    Past Surgical History  Procedure Laterality Date  . Cesarean section    . Abdominal surgery      tummy tuck  . Tubal ligation    . Abdominal hysterectomy     History reviewed. No pertinent family history. History  Substance Use Topics  . Smoking status: Former Smoker    Types: Cigarettes    Quit date: 07/02/2012  . Smokeless tobacco: Not on file  . Alcohol Use: Yes     Comment: sociaklly   OB History    No data available     Review of Systems  Constitutional: Positive for chills.  Respiratory: Positive for cough, chest tightness and shortness of breath.   Cardiovascular: Positive for chest pain.  Gastrointestinal: Positive for nausea and vomiting (posttussive).  All other systems reviewed and are negative.     Allergies  Review of patient's allergies indicates no known allergies.  Home Medications   Prior to Admission medications   Medication Sig Start Date End Date Taking? Authorizing Provider  celecoxib (CELEBREX) 200 MG capsule Take 200 mg by  mouth daily. 04/23/14  Yes Historical Provider, MD  Charcoal Activated (ACTIVATED CHARCOAL) POWD 1 Dose by Does not apply route daily.   Yes Historical Provider, MD  estradiol (ESTRACE) 1 MG tablet Take 1 mg by mouth daily. 05/14/14  Yes Historical Provider, MD  gabapentin (NEURONTIN) 300 MG capsule Take 300 mg by mouth 3 (three) times daily. 04/23/14  Yes Historical Provider, MD  ibuprofen (ADVIL,MOTRIN) 400 MG tablet Take 400 mg by mouth every 6 (six) hours as needed for fever, headache or mild pain.   Yes Historical Provider, MD  sertraline (ZOLOFT) 100 MG tablet Take 150 mg by mouth daily. 04/14/14  Yes Historical Provider, MD  albuterol (PROVENTIL HFA;VENTOLIN HFA) 108 (90 BASE) MCG/ACT inhaler Inhale 2 puffs into the lungs every 6 (six) hours as needed for wheezing or shortness of breath. 06/27/14   Malka Bocek L Aundra Pung, PA-C  HYDROcodone-homatropine (HYCODAN) 5-1.5 MG/5ML syrup Take 5 mLs by mouth every 6 (six) hours as needed for cough. 06/27/14   Tarita Deshmukh L Edell Mesenbrink, PA-C  oxyCODONE-acetaminophen (PERCOCET) 5-325 MG per tablet Take 1-2 tablets by mouth every 6 (six) hours as needed for severe pain. Patient not taking: Reported on 06/27/2014 07/02/13   Robyn M Hess, PA-C   BP 142/70 mmHg  Pulse 74  Temp(Src) 98.4 F (36.9 C) (Oral)  Resp 17  Ht 5\' 2"  (1.575 m)  Wt 190 lb (86.183 kg)  BMI 34.74 kg/m2  SpO2 95% Physical Exam  Constitutional: She is oriented to person, place, and time. She  appears well-developed and well-nourished. No distress.  HENT:  Head: Normocephalic and atraumatic.  Right Ear: External ear normal.  Left Ear: External ear normal.  Nose: Nose normal.  Mouth/Throat: Oropharynx is clear and moist. No oropharyngeal exudate.  Eyes: Conjunctivae are normal.  Neck: Neck supple.  Cardiovascular: Normal rate, regular rhythm, normal heart sounds and intact distal pulses.   Pulmonary/Chest: Effort normal and breath sounds normal. No respiratory distress. She exhibits  tenderness.  Abdominal: Soft. There is no tenderness.  Musculoskeletal: Normal range of motion. She exhibits no edema.  Neurological: She is alert and oriented to person, place, and time.  Skin: Skin is warm and dry. She is not diaphoretic.  Nursing note and vitals reviewed.   ED Course  Procedures (including critical care time) Medications  HYDROcodone-homatropine (HYCODAN) 5-1.5 MG/5ML syrup 5 mL (5 mLs Oral Given 06/27/14 1932)  iohexol (OMNIPAQUE) 350 MG/ML injection 100 mL (100 mLs Intravenous Contrast Given 06/27/14 1955)    Labs Review Labs Reviewed  CBC - Abnormal; Notable for the following:    WBC 12.0 (*)    All other components within normal limits  BASIC METABOLIC PANEL - Abnormal; Notable for the following:    Anion gap 19 (*)    All other components within normal limits  PRO B NATRIURETIC PEPTIDE  I-STAT TROPOININ, ED    Imaging Review Dg Chest 2 View  06/27/2014   CLINICAL DATA:  Left-sided chest pain, intermittent for 2 months. Recent onset of coughing congestion.  EXAM: CHEST  2 VIEW  COMPARISON:  None.  FINDINGS: Heart size is normal. Mediastinal shadows are normal. The lungs are clear. No bronchial thickening. No infiltrate, mass, effusion or collapse. Pulmonary vascularity is normal. No bony abnormality.  IMPRESSION: Normal chest   Electronically Signed   By: Paulina FusiMark  Shogry M.D.   On: 06/27/2014 16:02   Ct Angio Chest Pe W/cm &/or Wo Cm  06/27/2014   CLINICAL DATA:  Initial evaluation for chest pain and back pain with shortness of breath for 3 months  EXAM: CT ANGIOGRAPHY CHEST WITH CONTRAST  TECHNIQUE: Multidetector CT imaging of the chest was performed using the standard protocol during bolus administration of intravenous contrast. Multiplanar CT image reconstructions and MIPs were obtained to evaluate the vascular anatomy.  CONTRAST:  100mL OMNIPAQUE IOHEXOL 350 MG/ML SOLN  COMPARISON:  Chest radiograph performed 06/27/2014  FINDINGS: Mild dependent ground-glass  attenuation in both lungs likely representing atelectasis. 8 mm pulmonary nodule anterior aspect of right lower lobe.  No filling defects in the pulmonary arterial system. Thoracic aorta shows no dissection or dilatation.  Thoracic inlet is normal. Thyroid is normal. No significant hilar or mediastinal adenopathy. No pleural or pericardial effusion. Images to the upper abdomen are normal. No acute musculoskeletal findings.  Review of the MIP images confirms the above findings.  IMPRESSION: 1. No evidence of pulmonary embolism. 2. If the patient is at high risk for bronchogenic carcinoma, follow-up chest CT at 3-786months is recommended. If the patient is at low risk for bronchogenic carcinoma, follow-up chest CT at 6-12 months is recommended. This recommendation follows the consensus statement: Guidelines for Management of Small Pulmonary Nodules Detected on CT Scans: A Statement from the Fleischner Society as published in Radiology 2005; 237:395-400.   Electronically Signed   By: Esperanza Heiraymond  Rubner M.D.   On: 06/27/2014 20:24     EKG Interpretation None      MDM   Final diagnoses:  Shortness of breath  Tobacco abuse    Filed  Vitals:   06/27/14 2139  BP: 142/70  Pulse: 74  Temp: 98.4 F (36.9 C)  Resp: 17   Afebrile, NAD, non-toxic appearing, AAOx4.  Patient is to be discharged with recommendation to follow up with PCP in regards to today's hospital visit. Chest pain is not likely of cardiac or pulmonary etiology d/t presentation, negative CT angio chest, VSS, no tracheal deviation, no JVD or new murmur, RRR, breath sounds equal bilaterally, EKG without acute abnormalities, negative troponin, and negative CXR. Discussed pulmonary nodule with patient and need to follow up for repeat imaging given tobacco use. Patient is agreeable to plan.  Patient is stable at time of discharge Case has been discussed with and seen by Dr. Hyacinth MeekerMiller who agrees with the above plan to discharge.      Jeannetta EllisJennifer L  Rumaldo Difatta, PA-C 06/27/14 2157  Vida RollerBrian D Miller, MD 06/28/14 [id number]

## 2014-06-27 NOTE — ED Notes (Signed)
Pt reports having right side chest pains, sob and productive cough with green sputum for 3 months. Airway intact. Also reports chills and n/v recently.

## 2014-07-09 ENCOUNTER — Other Ambulatory Visit (HOSPITAL_COMMUNITY): Payer: Self-pay | Admitting: Respiratory Therapy

## 2014-07-09 DIAGNOSIS — R0602 Shortness of breath: Secondary | ICD-10-CM

## 2014-07-30 ENCOUNTER — Encounter (HOSPITAL_COMMUNITY): Payer: 59

## 2014-08-06 ENCOUNTER — Ambulatory Visit (HOSPITAL_COMMUNITY)
Admission: RE | Admit: 2014-08-06 | Discharge: 2014-08-06 | Disposition: A | Discharge status: Home / Self Care | Payer: 59 | Source: Ambulatory Visit | Attending: Family Medicine | Admitting: Family Medicine

## 2014-08-06 DIAGNOSIS — R0602 Shortness of breath: Secondary | ICD-10-CM | POA: Diagnosis present

## 2014-08-06 DIAGNOSIS — Z87891 Personal history of nicotine dependence: Secondary | ICD-10-CM | POA: Diagnosis not present

## 2014-08-06 LAB — PULMONARY FUNCTION TEST
DL/VA % PRED: 130 %
DL/VA: 5.94 ml/min/mmHg/L
DLCO COR % PRED: 105 %
DLCO UNC: 22.67 ml/min/mmHg
DLCO cor: 22.67 ml/min/mmHg
DLCO unc % pred: 105 %
FEF 25-75 POST: 3.71 L/s
FEF 25-75 Pre: 3.55 L/sec
FEF2575-%Change-Post: 4 %
FEF2575-%PRED-POST: 125 %
FEF2575-%PRED-PRE: 119 %
FEV1-%Change-Post: 1 %
FEV1-%Pred-Post: 96 %
FEV1-%Pred-Pre: 95 %
FEV1-POST: 2.71 L
FEV1-Pre: 2.68 L
FEV1FVC-%CHANGE-POST: 2 %
FEV1FVC-%Pred-Pre: 107 %
FEV6-%Change-Post: 0 %
FEV6-%Pred-Post: 89 %
FEV6-%Pred-Pre: 89 %
FEV6-POST: 3.01 L
FEV6-PRE: 3.03 L
FEV6FVC-%PRED-PRE: 102 %
FEV6FVC-%Pred-Post: 102 %
FVC-%Change-Post: 0 %
FVC-%Pred-Post: 87 %
FVC-%Pred-Pre: 88 %
FVC-Post: 3.01 L
FVC-Pre: 3.03 L
POST FEV6/FVC RATIO: 100 %
Post FEV1/FVC ratio: 90 %
Pre FEV1/FVC ratio: 88 %
Pre FEV6/FVC Ratio: 100 %
RV % pred: 81 %
RV: 1.22 L
TLC % pred: 91 %
TLC: 4.34 L

## 2014-08-06 MED ORDER — ALBUTEROL SULFATE (2.5 MG/3ML) 0.083% IN NEBU
2.5000 mg | INHALATION_SOLUTION | Freq: Once | RESPIRATORY_TRACT | Status: AC
  Administered 2014-08-06: 2.5 mg via RESPIRATORY_TRACT

## 2014-08-06 MED ORDER — ALBUTEROL SULFATE (2.5 MG/3ML) 0.083% IN NEBU: 2.5000 mg | INHALATION_SOLUTION | Freq: Once | RESPIRATORY_TRACT | Status: AC

## 2014-10-15 ENCOUNTER — Ambulatory Visit (HOSPITAL_COMMUNITY)
Admission: RE | Admit: 2014-10-15 | Discharge: 2014-10-15 | Disposition: A | Discharge status: Home / Self Care | Payer: 59 | Source: Ambulatory Visit | Attending: Family Medicine | Admitting: Family Medicine

## 2014-10-15 ENCOUNTER — Other Ambulatory Visit (HOSPITAL_COMMUNITY): Payer: Self-pay | Admitting: Cardiology

## 2014-10-15 DIAGNOSIS — Q248 Other specified congenital malformations of heart: Secondary | ICD-10-CM | POA: Diagnosis present

## 2014-10-15 DIAGNOSIS — R942 Abnormal results of pulmonary function studies: Secondary | ICD-10-CM

## 2014-10-15 MED ORDER — SODIUM CHLORIDE 0.9 % IV SOLN: INTRAVENOUS | Status: DC

## 2014-10-15 NOTE — Progress Notes (Signed)
Echocardiogram 2D Echocardiogram with Bubble Study has been performed.  Kim [name] 10/15/2014, 12:01 PM

## 2014-10-15 NOTE — OR Nursing (Signed)
Bubble study x 2  per protocol IV removed. Negative PFO. Negative Bubble

## 2016-11-29 IMAGING — CR DG CHEST 2V
2 series · 2 of 2 positions shown · non-contrast
Comparison: None.

CLINICAL DATA: Left-sided chest pain, intermittent for 2 months.
Recent onset of coughing congestion.

EXAM:
CHEST  2 VIEW

[chest pa]
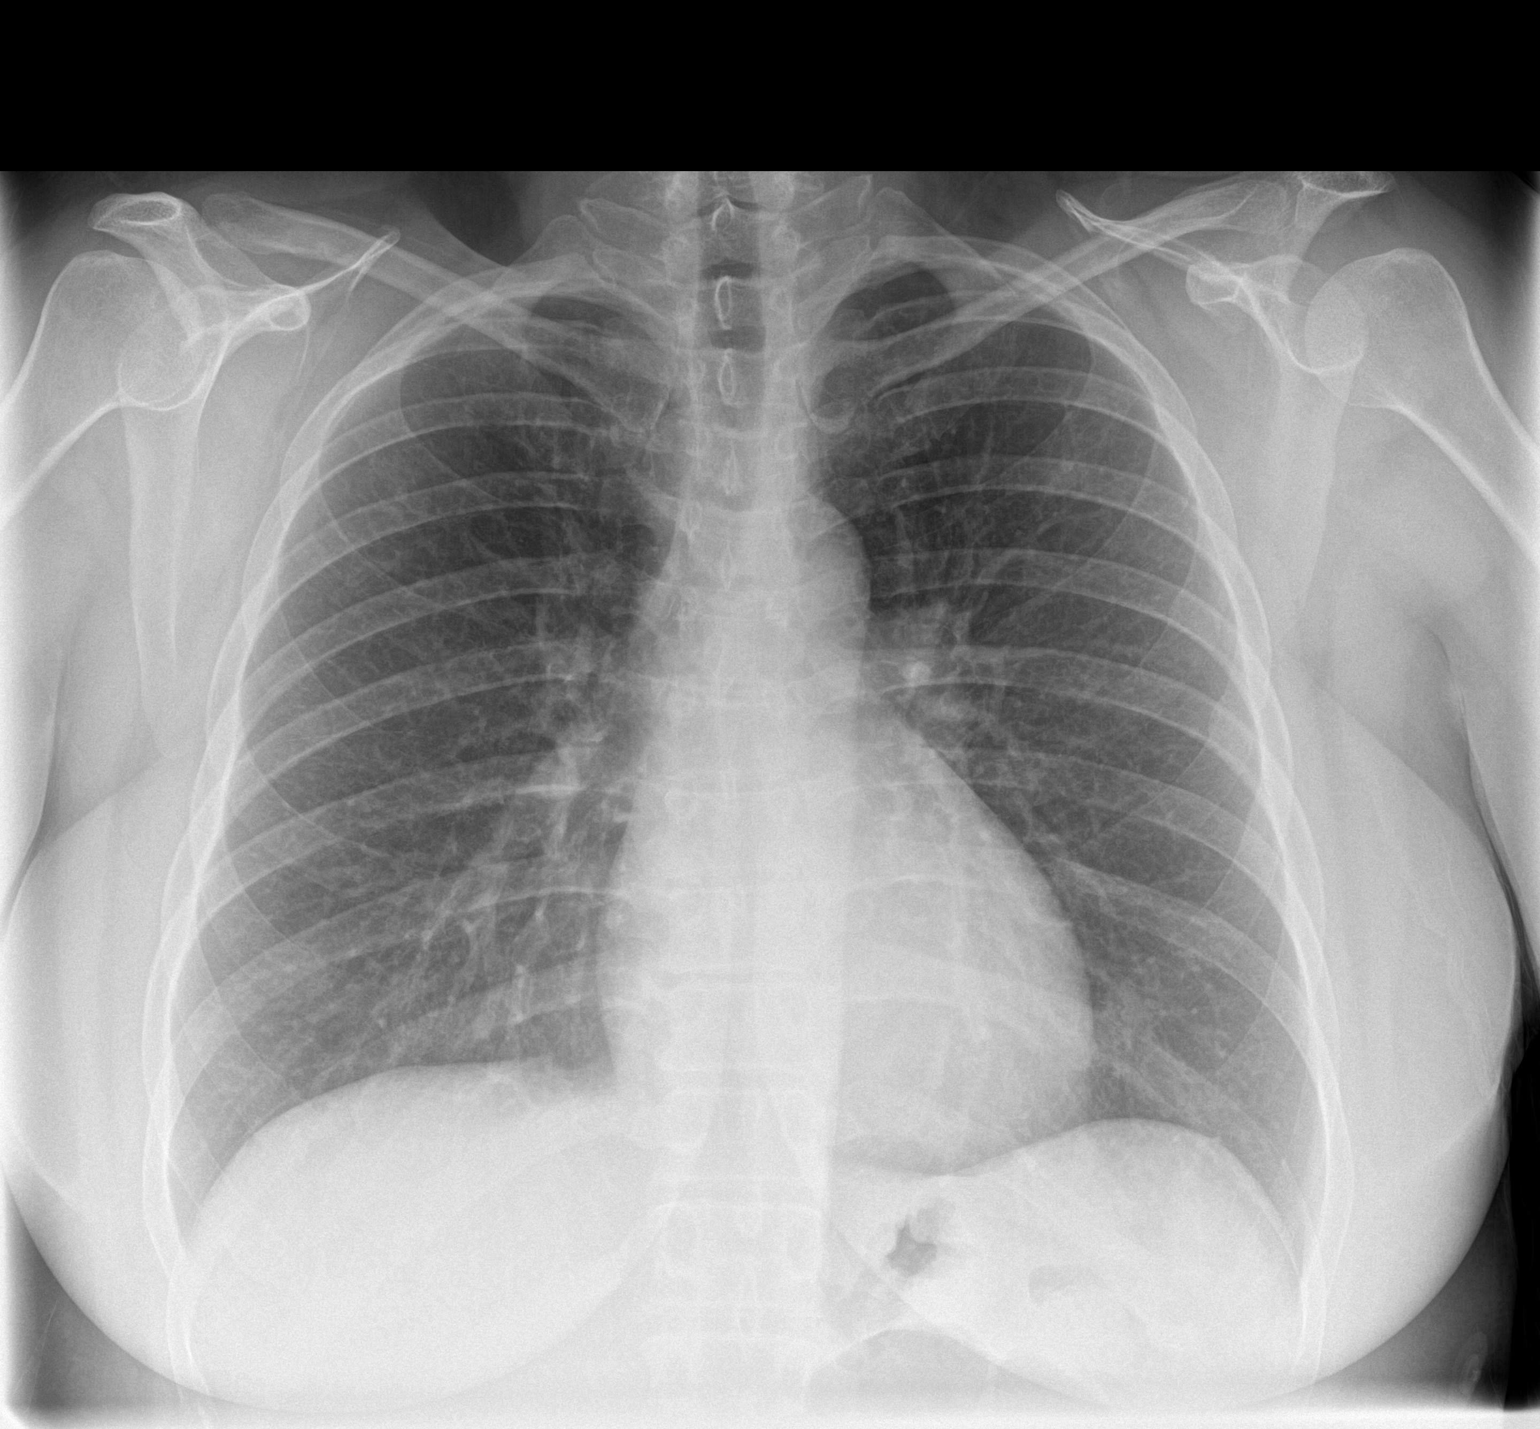

[chest lat]
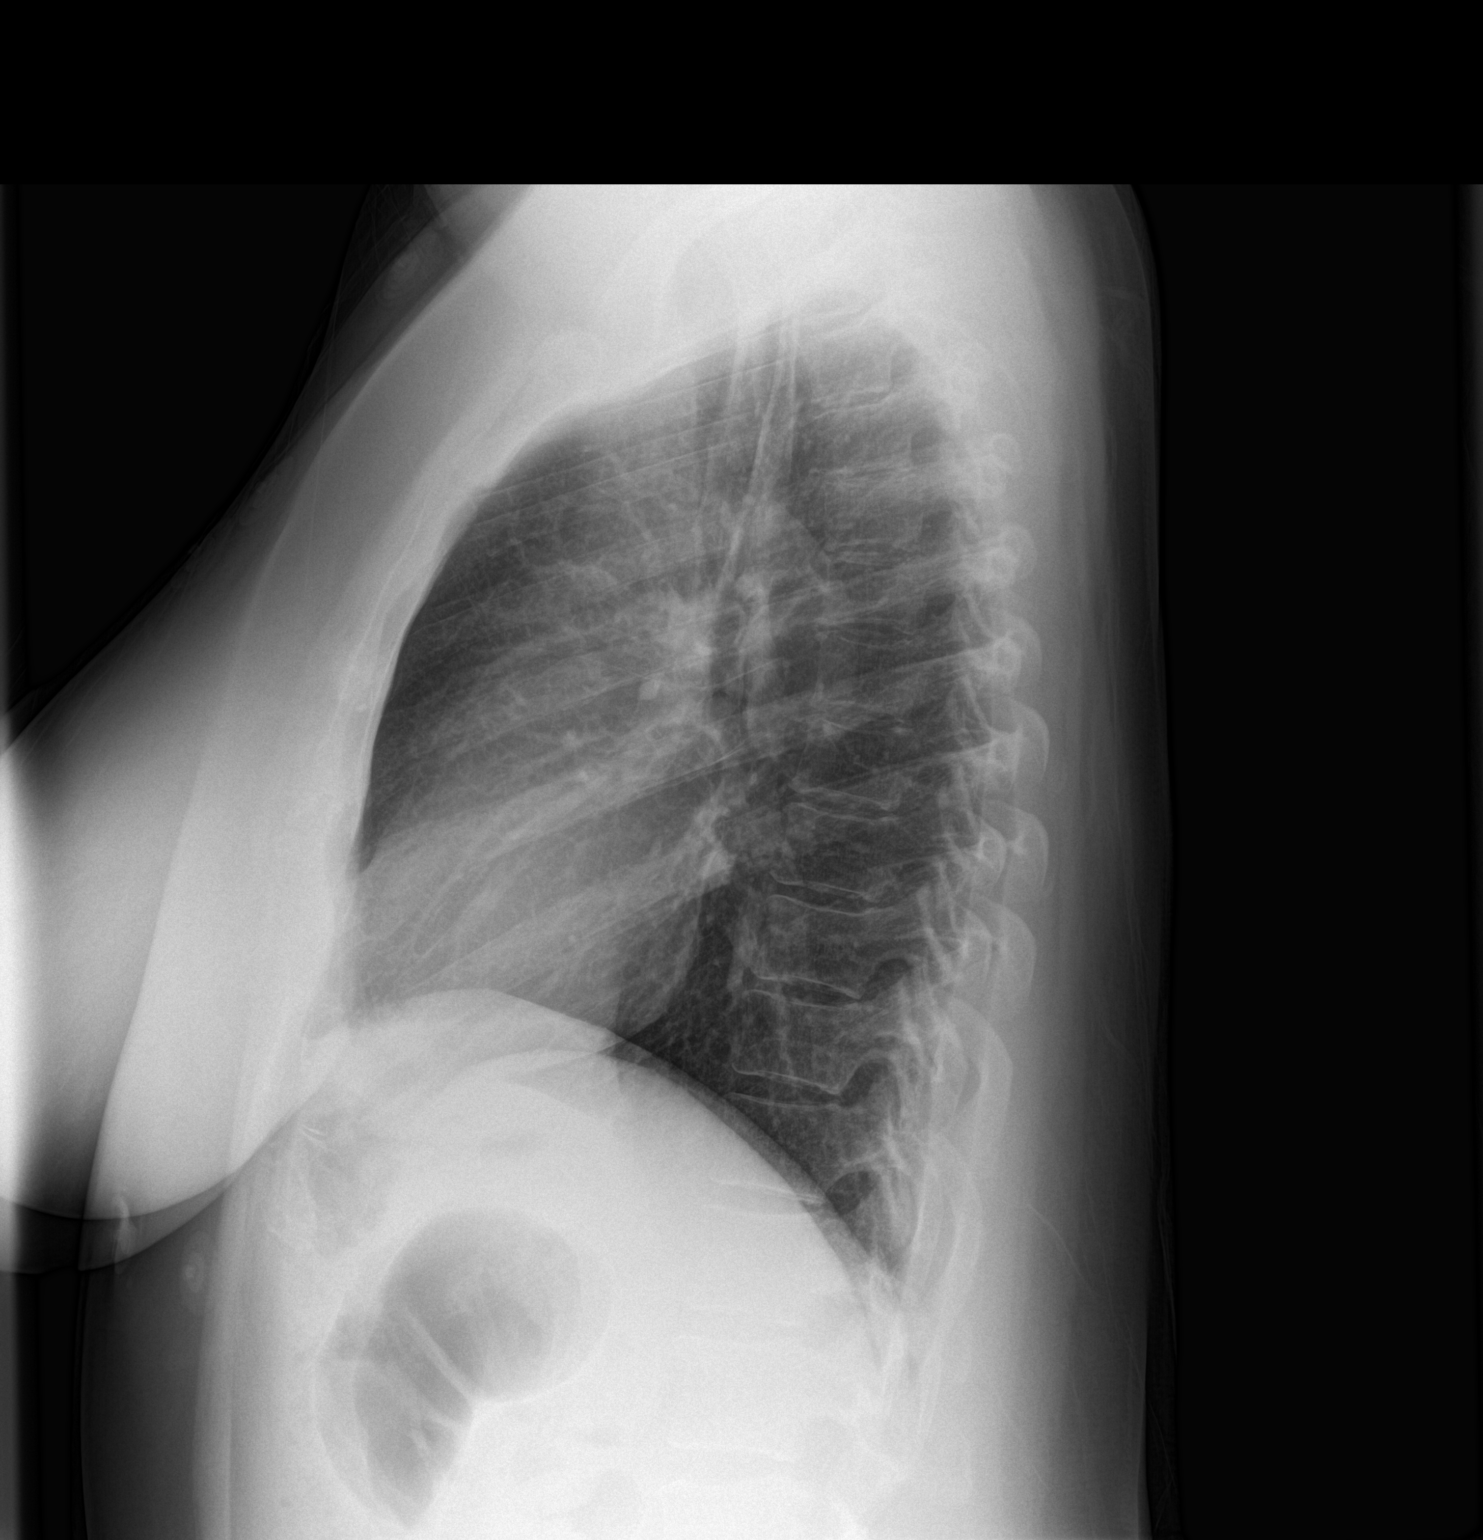

[2 of 2 positions shown; findings below may reference images not displayed]

FINDINGS: Heart size is normal. Mediastinal shadows are normal. The lungs are
clear. No bronchial thickening. No infiltrate, mass, effusion or
collapse. Pulmonary vascularity is normal. No bony abnormality.
IMPRESSION: Normal chest
